# Patient Record
Sex: Female | Born: 2000 | Hispanic: Yes | Marital: Single | Smoking: Never smoker
Health system: Southern US, Community
[De-identification: ages and names within clinical notes are randomized; demographics above are authoritative.]

## PROBLEM LIST (undated history)

## (undated) DIAGNOSIS — F32A Depression, unspecified: Secondary | ICD-10-CM

## (undated) DIAGNOSIS — M329 Systemic lupus erythematosus, unspecified: Secondary | ICD-10-CM

## (undated) DIAGNOSIS — IMO0002 Reserved for concepts with insufficient information to code with codable children: Secondary | ICD-10-CM

## (undated) HISTORY — DX: Depression, unspecified: F32.A

## (undated) HISTORY — PX: RENAL BIOPSY: SHX156

---
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
## 2022-02-12 DIAGNOSIS — J029 Acute pharyngitis, unspecified: Secondary | ICD-10-CM | POA: Diagnosis not present

## 2022-05-09 DIAGNOSIS — M3214 Glomerular disease in systemic lupus erythematosus: Secondary | ICD-10-CM | POA: Diagnosis not present

## 2022-08-08 DIAGNOSIS — M3214 Glomerular disease in systemic lupus erythematosus: Secondary | ICD-10-CM | POA: Diagnosis not present

## 2022-09-23 DIAGNOSIS — M3214 Glomerular disease in systemic lupus erythematosus: Secondary | ICD-10-CM | POA: Diagnosis not present

## 2022-09-25 ENCOUNTER — Encounter: Payer: Self-pay | Admitting: Family

## 2022-09-25 ENCOUNTER — Ambulatory Visit (INDEPENDENT_AMBULATORY_CARE_PROVIDER_SITE_OTHER): Payer: Medicaid Other | Admitting: Family

## 2022-09-25 VITALS — BP 102/80 | HR 61 | Temp 98.2°F | Ht 63.5 in | Wt 131.8 lb

## 2022-09-25 DIAGNOSIS — K5904 Chronic idiopathic constipation: Secondary | ICD-10-CM | POA: Diagnosis not present

## 2022-09-25 DIAGNOSIS — M3214 Glomerular disease in systemic lupus erythematosus: Secondary | ICD-10-CM | POA: Diagnosis not present

## 2022-09-25 DIAGNOSIS — F4323 Adjustment disorder with mixed anxiety and depressed mood: Secondary | ICD-10-CM | POA: Diagnosis not present

## 2022-09-25 MED ORDER — HYDROXYCHLOROQUINE SULFATE 200 MG PO TABS
200.0000 mg | ORAL_TABLET | Freq: Two times a day (BID) | ORAL | 0 refills | Status: AC
Start: 2022-09-25 — End: ?

## 2022-09-25 NOTE — Assessment & Plan Note (Signed)
Stable.  Recommendation for otc benefiber to make it easier to get fiber intake.

## 2022-09-25 NOTE — Patient Instructions (Signed)
   I think this is a great option and appreciate you reaching out. From my experience in the past as far as referrals I recommend you call and reach out to the following two places to see if they have any Medicaid openings for therapy.   If they do please let me know and I will place the referral to the appropriate place.  Here are some medicaid recommendations, if these do not work out I suggest that you call your insurance and see available options and we can also place that referral appropriately with whatever you may find.  [facility] Phone # [phone] They have locations in [place], [place], [place], and [place]. They do take [employer] I know for sure and the [employer].   [facility] [phone]  [address] [place], [place], [place] [address]    [facility] [employer] [address] [place], [place] [address]   [phone]   [facility]- [phone]   [facility] [phone]   [facility]- [phone]   If at any time you feel your needs are more urgent or you are concerned for your well being please see the below options:  For walk in options for mental health:   [facility] [address] in [place], [place]. Call our 24-hour HelpLine at [phone] or [phone] for immediate assistance for mental health and substance abuse issues.  And or walk into [facility] hospital ER.   [employer] [facility]: 1-800-SUICIDE  [employer] Suicide Prevention Lifeline: 1-800-273-TALK  Regards,   Mort Sawyers FNP-C

## 2022-09-25 NOTE — Progress Notes (Unsigned)
New Patient Office Visit  Subjective:  Patient ID: Brianna Andersen, female    DOB: 07/02/2000  Age: 22 y.o. MRN: [id number]  CC:  Chief Complaint  Patient presents with   Establish Care    HPI Brianna Andersen is here to establish care as a new patient.  Oriented to practice routines and expectations.  Prior provider was: [facility] [place].  nephrology, Dr. Dorna Bloom  Dr. Lady Gary, Rheumatologist  Pt is without acute concerns.   chronic concerns:  Regular diet.  Exercise regularly.  Sleeps fairly well, less since school started.   SLE with nephritis, followed by rheumatology as well as nephology. Diagnosed around 22 y/o . On plaquenil 200 mg twice daily. Up to date on her eye exams.   Anxiety depression: on sertraline 100 mg once daily. Increased anxiety with school, had went off of it due to brain fog and restarted and now with brain fog again. May be open to changing to another medication in the future.   Constipation, trying to add more fiber to get things moving. She finds this gets better with increased fiber intake. Water daily working on this well.   ROS: Negative unless specifically indicated above in HPI.   Current Outpatient Medications:    Cholecalciferol 25 MCG (1000 UT) tablet, Take by mouth., Disp: , Rfl:    Cyanocobalamin (VITAMIN B-12 PO), Take by mouth., Disp: , Rfl:    hydroxychloroquine (PLAQUENIL) 200 MG tablet, Take 1 tablet (200 mg total) by mouth 2 (two) times daily., Disp: 180 tablet, Rfl: 0   ondansetron (ZOFRAN-ODT) 4 MG disintegrating tablet, Take 1 tablet (4 mg total) by mouth every 8 (eight) hours as needed for nausea or vomiting., Disp: 20 tablet, Rfl: 0   sertraline (ZOLOFT) 100 MG tablet, Take 1 tablet (100 mg total) by mouth daily., Disp: 90 tablet, Rfl: 3 Past Medical History:  Diagnosis Date   Depression    Lupus (HCC)    Past Surgical History:  Procedure Laterality Date   RENAL BIOPSY       Objective:   Today's Vitals: BP 102/80 (BP Location: Right Arm, Patient Position: Sitting, Cuff Size: Normal)   Pulse 61   Temp 98.2 F (36.8 C) (Temporal)   Ht 5' 3.5" (1.613 m)   Wt 131 lb 12.8 oz (59.8 kg)   LMP 09/18/2022   SpO2 97%   BMI 22.98 kg/m   Physical Exam Constitutional:      General: She is not in acute distress.    Appearance: Normal appearance. She is normal weight. She is not ill-appearing, toxic-appearing or diaphoretic.  HENT:     Head: Normocephalic.  Cardiovascular:     Rate and Rhythm: Normal rate and regular rhythm.  Pulmonary:     Effort: Pulmonary effort is normal.     Breath sounds: Normal breath sounds.  Musculoskeletal:        General: Normal range of motion.  Neurological:     General: No focal deficit present.     Mental Status: She is alert and oriented to person, place, and time. Mental status is at baseline.  Psychiatric:        Mood and Affect: Mood normal.        Behavior: Behavior normal.        Thought Content: Thought content normal.        Judgment: Judgment normal.     Assessment & Plan:  Lupus nephritis (HCC) Assessment & Plan: Follow up with  rheumatology and nephrology as scheduled. Continue plaquenil as prescribed.    Orders: -     Hydroxychloroquine Sulfate; Take 1 tablet (200 mg total) by mouth 2 (two) times daily.  Dispense: 180 tablet; Refill: 0  Adjustment disorder with mixed anxiety and depressed mood Assessment & Plan: Continue sertraline 100 mg once daily.  Consider lexapro in the future, once pt complete with chemistry semester.  Open to therapy.    Chronic idiopathic constipation Assessment & Plan: Stable.  Recommendation for otc benefiber to make it easier to get fiber intake.      Follow-up: Return in about 1 year (around 09/25/2023) for f/u CPE and pap .   Mort Sawyers, FNP

## 2022-09-25 NOTE — Assessment & Plan Note (Signed)
Continue sertraline 100 mg once daily.  Consider lexapro in the future, once pt complete with chemistry semester.  Open to therapy.

## 2022-09-25 NOTE — Assessment & Plan Note (Addendum)
Follow up with rheumatology and nephrology as scheduled. Continue plaquenil as prescribed.

## 2022-10-11 ENCOUNTER — Encounter: Payer: Self-pay | Admitting: Family

## 2022-10-14 ENCOUNTER — Ambulatory Visit (INDEPENDENT_AMBULATORY_CARE_PROVIDER_SITE_OTHER): Payer: Medicaid Other | Admitting: Internal Medicine

## 2022-10-14 ENCOUNTER — Ambulatory Visit (INDEPENDENT_AMBULATORY_CARE_PROVIDER_SITE_OTHER)
Admission: RE | Admit: 2022-10-14 | Discharge: 2022-10-14 | Disposition: A | Discharge status: Home / Self Care | Payer: Medicaid Other | Source: Ambulatory Visit | Attending: Internal Medicine | Admitting: Internal Medicine

## 2022-10-14 ENCOUNTER — Encounter: Payer: Self-pay | Admitting: Internal Medicine

## 2022-10-14 VITALS — BP 110/70 | HR 79 | Temp 99.1°F | Ht 63.5 in | Wt 132.0 lb

## 2022-10-14 DIAGNOSIS — M25531 Pain in right wrist: Secondary | ICD-10-CM

## 2022-10-14 NOTE — Assessment & Plan Note (Addendum)
Pull injury She feels it was dislocated and she reduced it Still tender Will check x-ray--looks okay but awaiting radiologist reading  If any findings--will set up with ortho Continue the brace Ice if pain Tylenol---can consider topical diclofenac (shouldn't affect kidneys) If x-ray negative--and ongoing pain in a week or so--would proceed with evaluation by sports medicine or ortho

## 2022-10-14 NOTE — Progress Notes (Signed)
Subjective:    Patient ID: Brianna Andersen, female    DOB: 07/06/2000, 22 y.o.   MRN: [id number]  HPI Here due to wrist pain  1 week ago--was walking dog and saw squirrel and ran (60#) Was able to hold her back--but noticed pain in her right wrist but not bad 3 days went by and felt her bone was in the wrong position Thinks it might have been dislocated--on ulnar side She was able to push it back--but still having pain  Trouble using it---worse though without the brace Some swelling at first---better with ice and tylenol Still using this  Current Outpatient Medications on File Prior to Visit  Medication Sig Dispense Refill   Cholecalciferol 25 MCG (1000 UT) tablet Take by mouth.     Cyanocobalamin (VITAMIN B-12 PO) Take by mouth.     hydroxychloroquine (PLAQUENIL) 200 MG tablet Take 1 tablet (200 mg total) by mouth 2 (two) times daily. 180 tablet 0   ondansetron (ZOFRAN-ODT) 4 MG disintegrating tablet Take 1 tablet (4 mg total) by mouth every 8 (eight) hours as needed for nausea or vomiting. 20 tablet 0   sertraline (ZOLOFT) 100 MG tablet Take 1 tablet (100 mg total) by mouth daily. 90 tablet 3   No current facility-administered medications on file prior to visit.    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  Tobacco Use   Smoking status: Never   Smokeless tobacco: Never  Vaping Use   Vaping status: Never Used  Substance and Sexual Activity   Alcohol use: No   Drug use: No   Sexual activity:  Never    Birth control/protection: Abstinence  Other Topics Concern   Not on file  Social History Narrative   Not on file   Social Determinants of Health   Financial Resource Strain: Not on file  Food Insecurity: No Food Insecurity (08/08/2022)   Received from [facility]   Hunger Vital Sign    Worried About Running Out of Food in the Last Year: Never true    Ran Out of Food in the Last Year: Never true  Transportation Needs: Not on file  Physical Activity: Not on file  Stress: Not on file  Social Connections: Not on file  Intimate Partner Violence: Not on file   Review of Systems     Objective:   Physical Exam Constitutional:      Appearance: Normal appearance.  Musculoskeletal:     Comments: Prominence of distal ulna at both wrists Tenderness on right at ulnar wrist and proximal hand  Neurological:     Mental Status: She is alert.            Assessment & Plan:

## 2022-12-04 DIAGNOSIS — M3214 Glomerular disease in systemic lupus erythematosus: Secondary | ICD-10-CM | POA: Diagnosis not present

## 2022-12-13 ENCOUNTER — Ambulatory Visit (HOSPITAL_COMMUNITY)
Admission: EM | Admit: 2022-12-13 | Discharge: 2022-12-13 | Disposition: A | Discharge status: Home / Self Care | Payer: Medicaid Other | Attending: Emergency Medicine | Admitting: Emergency Medicine

## 2022-12-13 ENCOUNTER — Encounter (HOSPITAL_COMMUNITY): Payer: Self-pay | Admitting: Emergency Medicine

## 2022-12-13 DIAGNOSIS — Z8739 Personal history of other diseases of the musculoskeletal system and connective tissue: Secondary | ICD-10-CM | POA: Insufficient documentation

## 2022-12-13 DIAGNOSIS — R609 Edema, unspecified: Secondary | ICD-10-CM | POA: Diagnosis not present

## 2022-12-13 DIAGNOSIS — L509 Urticaria, unspecified: Secondary | ICD-10-CM | POA: Diagnosis not present

## 2022-12-13 DIAGNOSIS — N9089 Other specified noninflammatory disorders of vulva and perineum: Secondary | ICD-10-CM | POA: Insufficient documentation

## 2022-12-13 LAB — CBC WITH DIFFERENTIAL/PLATELET
Abs Immature Granulocytes: 0 10*3/uL (ref 0.00–0.07)
Basophils Absolute: 0 10*3/uL (ref 0.0–0.1)
Basophils Relative: 0 %
Eosinophils Absolute: 0 10*3/uL (ref 0.0–0.5)
Eosinophils Relative: 0 %
HCT: 38.6 % (ref 36.0–46.0)
Hemoglobin: 13.3 g/dL (ref 12.0–15.0)
Immature Granulocytes: 0 %
Lymphocytes Relative: 33 %
Lymphs Abs: 1.5 10*3/uL (ref 0.7–4.0)
MCH: 29.5 pg (ref 26.0–34.0)
MCHC: 34.5 g/dL (ref 30.0–36.0)
MCV: 85.6 fL (ref 80.0–100.0)
Monocytes Absolute: 0.5 10*3/uL (ref 0.1–1.0)
Monocytes Relative: 11 %
Neutro Abs: 2.5 10*3/uL (ref 1.7–7.7)
Neutrophils Relative %: 56 %
Platelets: 187 10*3/uL (ref 150–400)
RBC: 4.51 MIL/uL (ref 3.87–5.11)
RDW: 12.2 % (ref 11.5–15.5)
WBC: 4.6 10*3/uL (ref 4.0–10.5)
nRBC: 0 % (ref 0.0–0.2)

## 2022-12-13 LAB — COMPREHENSIVE METABOLIC PANEL
ALT: 21 U/L (ref 0–44)
AST: 30 U/L (ref 15–41)
Albumin: 4.6 g/dL (ref 3.5–5.0)
Alkaline Phosphatase: 65 U/L (ref 38–126)
Anion gap: 9 (ref 5–15)
BUN: <5 mg/dL — ABNORMAL LOW (ref 6–20)
CO2: 23 mmol/L (ref 22–32)
Calcium: 9.5 mg/dL (ref 8.9–10.3)
Chloride: 107 mmol/L (ref 98–111)
Creatinine, Ser: 0.5 mg/dL (ref 0.44–1.00)
GFR, Estimated: >60 mL/min (ref >60)
Glucose, Bld: 89 mg/dL (ref 70–99)
Potassium: 3.5 mmol/L (ref 3.5–5.1)
Sodium: 139 mmol/L (ref 135–145)
Total Bilirubin: 1.7 mg/dL — ABNORMAL HIGH (ref <1.2)
Total Protein: 7.9 g/dL (ref 6.5–8.1)

## 2022-12-13 LAB — C-REACTIVE PROTEIN: CRP: <0.5 mg/dL (ref <1.0)

## 2022-12-13 MED ORDER — METHYLPREDNISOLONE 4 MG PO TBPK: ORAL_TABLET | ORAL | 0 refills | Status: DC

## 2022-12-13 MED ORDER — METHYLPREDNISOLONE ACETATE 80 MG/ML IJ SUSP
INTRAMUSCULAR | Status: AC
  Filled 2022-12-13: qty 1

## 2022-12-13 MED ORDER — METHYLPREDNISOLONE ACETATE 80 MG/ML IJ SUSP
60.0000 mg | Freq: Once | INTRAMUSCULAR | Status: AC
  Administered 2022-12-13: 60 mg via INTRAMUSCULAR

## 2022-12-13 NOTE — ED Provider Notes (Signed)
[facility]    CSN: [id number] Arrival date & time: 12/13/22  1329    HISTORY   Chief Complaint  Patient presents with   Urticaria   HPI Brianna Andersen is a pleasant, 22 y.o. female who presents to urgent care today. Patient complains of hives all over her body that began late yesterday.  Patient states she has noted no change in diet, personal hygiene products, household detergents or medications.  Patient states she took an extra strength Benadryl which did not provide any improvement of her symptoms.  Patient reports a history of lupus with abnormal lab results 9 days ago, states she has been taking Plaquenil as prescribed but is concerned that her lupus is is getting worse.  The history is provided by the patient.   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    Home Medications    Prior to Admission medications   Medication Sig Start Date End Date Taking? Authorizing Provider  ferrous sulfate 325 (65 FE) MG tablet Take 325 mg by mouth daily with breakfast. 12/05/22 12/05/23 Yes [provider]  Cholecalciferol 25 MCG (1000 UT) tablet Take by mouth.    [provider]  Cyanocobalamin (VITAMIN B-12 PO) Take by mouth.    [provider]  hydroxychloroquine (PLAQUENIL) 200 MG tablet Take 1 tablet (200 mg total) by mouth 2 (two) times daily. 09/25/22   Mort Sawyers, FNP  ondansetron (ZOFRAN-ODT) 4 MG disintegrating tablet Take 1 tablet (4 mg total) by mouth every 8 (eight) hours as needed for nausea or  vomiting. 02/22/19   Lady Deutscher, MD  sertraline (ZOLOFT) 100 MG tablet Take 1 tablet (100 mg total) by mouth daily. 12/13/20   Lady Deutscher, MD    Family History Family History  Problem Relation Age of Onset   Stroke Paternal Grandfather    Social History Social History   Tobacco Use   Smoking status: Never   Smokeless tobacco: Never  Vaping Use   Vaping status: Never Used  Substance Use Topics   Alcohol use: No   Drug use: No   Allergies   Patient has no known allergies.  Review of Systems Review of Systems Pertinent findings revealed after performing a 14 point review of systems has been noted in the history of present illness.  Physical Exam Vital Signs BP (!) 128/96 (BP Location: Left Arm)   Pulse 98   Temp 98.4 F (36.9 C) (Oral)   Resp 16   LMP 11/19/2022 (Exact Date)   SpO2 95%   No data found.  Physical Exam Vitals and nursing note reviewed.  Constitutional:      General: She is not in acute distress.    Appearance: Normal appearance.  HENT:     Head: Normocephalic and atraumatic.  Eyes:     Pupils: Pupils are equal, round, and reactive to light.  Cardiovascular:     Rate and Rhythm: Normal rate and regular rhythm.  Pulmonary:     Effort: Pulmonary effort is  normal.     Breath sounds: Normal breath sounds.  Musculoskeletal:        General: Normal range of motion.     Cervical back: Normal range of motion and neck supple.  Skin:    General: Skin is warm and dry.     Findings: Rash (Urticarial rash located on abdomen and back.  Patient reports itching on upper and lower extremities but I do not appreciate any signs of urticaria in these areas.) present.  Neurological:     General: No focal deficit present.     Mental Status: She is alert and oriented to person, place, and time. Mental status is at baseline.  Psychiatric:        Mood and Affect: Mood normal.        Behavior: Behavior normal.        Thought Content: Thought content normal.         Judgment: Judgment normal.     Visual Acuity Right Eye Distance:   Left Eye Distance:   Bilateral Distance:    Right Eye Near:   Left Eye Near:    Bilateral Near:     UC Couse / Diagnostics / Procedures:     Radiology No results found.  Procedures Procedures (including critical care time) EKG  Pending results:  Labs Reviewed  COMPREHENSIVE METABOLIC PANEL  C-REACTIVE PROTEIN  CBC WITH DIFFERENTIAL/PLATELET  CERVICOVAGINAL ANCILLARY ONLY    Medications Ordered in UC: Medications  methylPREDNISolone acetate (DEPO-MEDROL) injection 60 mg (has no administration in time range)    UC Diagnoses / Final Clinical Impressions(s)   I have reviewed the triage vital signs and the nursing notes.  Pertinent labs & imaging results that were available during my care of the patient were reviewed by me and considered in my medical decision making (see chart for details).    Final diagnoses:  Urticaria  History of lupus nephritis  Vulvar edema   Patient provided with an injection of methylprednisolone during her visit for rapid relief of hives.  Patient advised to continue a tapering dose of methylprednisolone as well.    Comprehensive metabolic panel repeated due to history of lupus nephritis.  If kidney function is worsening, recommend acute follow-up with her lupus doctor.  CBC performed to look for eosinophils and CRP were performed to look for acute inflammation, both of which would help determine whether her urticaria is due to an acute allergic reaction or to a resurgence of her lupus nephritis.  Patient advised to follow-up with her lupus doctor regardless to report symptoms and treatment discussed and provided today.  Please see discharge instructions below for details of plan of care as provided to patient. ED Prescriptions     Medication Sig Dispense Auth. Provider   methylPREDNISolone (MEDROL DOSEPAK) 4 MG TBPK tablet Take 24 mg on day 1, 20 mg on day 2, 16 mg on  day 3, 12 mg on day 4, 8 mg on day 5, 4 mg on day 6.  Take all tablets in each row at once, do not spread tablets out throughout the day. 21 tablet Theadora Rama Scales, PA-C      PDMP not reviewed this encounter.  Pending results:  Labs Reviewed  COMPREHENSIVE METABOLIC PANEL  C-REACTIVE PROTEIN  CBC WITH DIFFERENTIAL/PLATELET  CERVICOVAGINAL ANCILLARY ONLY    Discharge Instructions:   Discharge Instructions      For acute treatment of urticaria, you are provided with an injection of methylprednisolone during your visit, this is a  potent steroid which should quickly resolve your symptoms of itching, swelling and redness.    For continued suppression of your itching, swelling and redness, please pick up and begin your prescription for methylprednisolone tablets.  Please take 1 row of tablets each morning with your breakfast meal starting tomorrow morning.  We have repeated some of the blood work that was performed by your lupus provider 9 days ago.  These include a complete metabolic panel which evaluates your liver and kidney function, a complete blood cell count which will help [place] determine whether or not your hives are related to an allergic reaction or to your lupus and a C-reactive protein test was also looks for acute inflammation versus worsening of your lupus.  You were also tested for vaginal yeast infection.  We will contact you with the results of these tests once we have received them from the lab.  If any further treatment is needed, we will let you know.  Thank you for visiting [facility] today.  We appreciate the opportunity to participate in your care.      Disposition Upon Discharge:  Condition: stable for discharge home  Patient presented with an acute illness with associated systemic symptoms and significant discomfort requiring urgent management. In my opinion, this is a condition that a prudent lay person (someone who possesses an average  knowledge of health and medicine) may potentially expect to result in complications if not addressed urgently such as respiratory distress, impairment of bodily function or dysfunction of bodily organs.   Routine symptom specific, illness specific and/or disease specific instructions were discussed with the patient and/or caregiver at length.   As such, the patient has been evaluated and assessed, work-up was performed and treatment was provided in alignment with urgent care protocols and evidence based medicine.  Patient/parent/caregiver has been advised that the patient may require follow up for further testing and treatment if the symptoms continue in spite of treatment, as clinically indicated and appropriate.  Patient/parent/caregiver has been advised to return to the [facility] or PCP if no better; to PCP or the Emergency Department if new signs and symptoms develop, or if the current signs or symptoms continue to change or worsen for further workup, evaluation and treatment as clinically indicated and appropriate  The patient will follow up with their current PCP if and as advised. If the patient does not currently have a PCP we will assist them in obtaining one.   The patient may need specialty follow up if the symptoms continue, in spite of conservative treatment and management, for further workup, evaluation, consultation and treatment as clinically indicated and appropriate.  Patient/parent/caregiver verbalized understanding and agreement of plan as discussed.  All questions were addressed during visit.  Please see discharge instructions below for further details of plan.  This office note has been dictated using Teaching laboratory technician.  Unfortunately, this method of dictation can sometimes lead to typographical or grammatical errors.  I apologize for your inconvenience in advance if this occurs.  Please do not hesitate to reach out to me if clarification is needed.      Theadora Rama Scales, [place] 12/13/22 [phone]

## 2022-12-13 NOTE — Discharge Instructions (Addendum)
For acute treatment of urticaria, you are provided with an injection of methylprednisolone during your visit, this is a potent steroid which should quickly resolve your symptoms of itching, swelling and redness.    For continued suppression of your itching, swelling and redness, please pick up and begin your prescription for methylprednisolone tablets.  Please take 1 row of tablets each morning with your breakfast meal starting tomorrow morning.  We have repeated some of the blood work that was performed by your lupus provider 9 days ago.  These include a complete metabolic panel which evaluates your liver and kidney function, a complete blood cell count which will help [place] determine whether or not your hives are related to an allergic reaction or to your lupus and a C-reactive protein test was also looks for acute inflammation versus worsening of your lupus.  You were also tested for vaginal yeast infection.  We will contact you with the results of these tests once we have received them from the lab.  If any further treatment is needed, we will let you know.  Thank you for visiting [facility] today.  We appreciate the opportunity to participate in your care.

## 2022-12-13 NOTE — ED Triage Notes (Signed)
Pt present with hives all over body that began late yesterday.  Pt states she has had no changes in diet or products recently. She took extra strength benadryl that did not help with hives.  She is also concerned because she has lupus and was told her labs were off recently

## 2022-12-18 LAB — CERVICOVAGINAL ANCILLARY ONLY
Bacterial Vaginitis (gardnerella): NEGATIVE
Candida Glabrata: NEGATIVE
Candida Vaginitis: NEGATIVE
Comment: NEGATIVE
Comment: NEGATIVE
Comment: NEGATIVE

## 2023-04-25 ENCOUNTER — Ambulatory Visit: Payer: Self-pay

## 2023-04-25 NOTE — Telephone Encounter (Signed)
 Second attempt to reach pt. Used Doctor, hospital # [phone]. Left message to call back.

## 2023-04-25 NOTE — Telephone Encounter (Signed)
 Reason for Triage: Patient has a bump near her anus area 3 days ago, hurts when pressed. Wanting to know if something can be called in for her   Used Spanish interpreter [place] # [phone], left message to call back.

## 2023-04-28 NOTE — Telephone Encounter (Signed)
 NOTED

## 2023-06-23 DIAGNOSIS — M3214 Glomerular disease in systemic lupus erythematosus: Secondary | ICD-10-CM | POA: Diagnosis not present

## 2023-08-15 ENCOUNTER — Ambulatory Visit: Payer: Self-pay

## 2023-08-15 NOTE — Telephone Encounter (Signed)
 FYI Only or Action Required?: FYI only for provider.  Patient was last seen in primary care on 10/14/2022 by Jimmy Charlie FERNS, MD.  Called Nurse Triage reporting PARASITES.  Symptoms began a week ago.  Interventions attempted: Nothing.  Symptoms are: unchanged.  Triage Disposition: No disposition on file.  Patient/caregiver understands and will follow disposition?:  Reason for Disposition  [1] Abnormal color is unexplained AND [2] persists > 24 hours  Answer Assessment - Initial Assessment Questions Patient unable to be seen until Monday. ED precautions reviewed, pt verbalized understanding.   1. COLOR: What color is your stool? Is that color in part or all of the stool? (e.g., black, clay-colored, green, red)      Normal color, formed stool with white worms in it. Vacationed in [place] in January.  2. ONSET: When did you first notice this color change?     One week  3. CAUSE: Have you eaten any food or taken any medicine of this color? Note: See listing in Background Information section.      No  4. OTHER SYMPTOMS: Do you have any other symptoms? (e.g., abdomen pain, diarrhea, fever, yellow eyes or skin).     Mild bloating. Denies abdominal pain, fever, diarrhea, nausea, vomiting or blood in the stool  Protocols used: Stools - Unusual Color-A-AH Copied from CRM [id number]. Topic: Clinical - Red Word Triage >> Aug 15, 2023 11:27 AM Harlene ORN wrote: Red Word that prompted transfer to Nurse Triage: came back from [place] in January. Now showing signs of intestinal parasites. White worms in her stool.

## 2023-08-18 ENCOUNTER — Encounter: Payer: Self-pay | Admitting: Family

## 2023-08-18 ENCOUNTER — Ambulatory Visit (INDEPENDENT_AMBULATORY_CARE_PROVIDER_SITE_OTHER): Admitting: Family

## 2023-08-18 VITALS — BP 114/68 | HR 75 | Temp 98.4°F | Ht 63.5 in | Wt 137.6 lb

## 2023-08-18 DIAGNOSIS — B839 Helminthiasis, unspecified: Secondary | ICD-10-CM | POA: Diagnosis not present

## 2023-08-18 MED ORDER — ALBENDAZOLE 200 MG PO TABS: ORAL_TABLET | ORAL | 0 refills | Status: AC

## 2023-08-18 NOTE — Telephone Encounter (Signed)
 NOTED

## 2023-08-18 NOTE — Progress Notes (Unsigned)
 Established Patient Office Visit  Subjective:   Patient ID: Brianna Andersen, female    DOB: [birth year]  Age: 23 y.o. MRN: [id number]  CC:  Chief Complaint  Patient presents with   Acute Visit    1-2 weeks ago and she noticed a white worm in her stool. Her sister was diagnosed with worms. She thinks this may be from a recent trip to [place].    HPI: Brianna Andersen is a 23 y.o. female presenting on 08/18/2023 for Acute Visit (1-2 weeks ago and she noticed a white worm in her stool. Her sister was diagnosed with worms. She thinks this may be from a recent trip to [place].)  1-2 weeks ago noticed a white worm in her stool. She has noticed more in her stool since, not moving. No abn weight loss, no fever. No abdominal pain or heart burn. No change in bowel movements.  Went to [place] back in January.  Sister was diagnosed with worms and she was given treatment, she was treated for pinworms.   Wt Readings from Last 3 Encounters:  08/18/23 137 lb 9.6 oz (62.4 kg)  10/14/22 132 lb (59.9 kg)  09/25/22 131 lb 12.8 oz (59.8 kg)           ROS: Negative unless specifically indicated above in HPI.   Relevant past medical history reviewed and updated as indicated.   Allergies and medications reviewed and updated.   Current Outpatient Medications:    albendazole  (ALBENZA ) 200 MG tablet, Take two pills for one dose and then repeat again in two weeks x one dose, Disp: 4 tablet, Rfl: 0   Cholecalciferol 25 MCG (1000 UT) tablet, Take by mouth., Disp: , Rfl:    Cyanocobalamin (VITAMIN B-12 PO), Take by mouth., Disp: , Rfl:    ferrous sulfate 325 (65 FE) MG tablet, Take 325 mg by mouth daily with breakfast., Disp: , Rfl:    hydroxychloroquine  (PLAQUENIL ) 200 MG tablet, Take 1 tablet (200 mg total) by mouth 2 (two) times daily., Disp: 180 tablet, Rfl: 0  No Known Allergies  Objective:   BP 114/68 (BP Location: Left Arm, Patient Position: Sitting, Cuff Size: Normal)   Pulse  75   Temp 98.4 F (36.9 C) (Temporal)   Ht 5' 3.5 (1.613 m)   Wt 137 lb 9.6 oz (62.4 kg)   LMP 08/02/2023 (Exact Date)   SpO2 100%   BMI 23.99 kg/m    Physical Exam Vitals reviewed.  Constitutional:      General: She is not in acute distress.    Appearance: Normal appearance. She is normal weight. She is not ill-appearing, toxic-appearing or diaphoretic.  HENT:     Head: Normocephalic.  Cardiovascular:     Rate and Rhythm: Normal rate.  Pulmonary:     Effort: Pulmonary effort is normal.  Musculoskeletal:        General: Normal range of motion.  Neurological:     General: No focal deficit present.     Mental Status: She is alert and oriented to person, place, and time. Mental status is at baseline.  Psychiatric:        Mood and Affect: Mood normal.        Behavior: Behavior normal.        Thought Content: Thought content normal.        Judgment: Judgment normal.     Assessment & Plan:  Worms in stool Assessment & Plan: Confirmed exposure to pin worms with sister Advised her  to notify everyone in the household to be treated Rx albendazole  400 mg x one dose, repeat again in two weeks.  Stool cultures ordered pending  Orders: -     Albendazole ; Take two pills for one dose and then repeat again in two weeks x one dose  Dispense: 4 tablet; Refill: 0 -     Ova and parasite examination; Future     Follow up plan: Return if symptoms worsen or fail to improve.  Ginger Patrick, FNP

## 2023-08-19 NOTE — Assessment & Plan Note (Addendum)
 Confirmed exposure to pin worms with sister Advised her to notify everyone in the household to be treated Rx albendazole  400 mg x one dose, repeat again in two weeks.  Stool cultures ordered pending

## 2023-08-25 ENCOUNTER — Telehealth: Payer: Self-pay | Admitting: Family

## 2023-08-25 ENCOUNTER — Other Ambulatory Visit: Payer: Self-pay

## 2023-08-25 DIAGNOSIS — B839 Helminthiasis, unspecified: Secondary | ICD-10-CM | POA: Diagnosis not present

## 2023-08-25 NOTE — Telephone Encounter (Signed)
 Copied from CRM [id number]. Topic: General - Other >> Aug 25, 2023  1:23 PM Brianna Andersen wrote: Reason for CRM: Patient would like to know how she should do her stool test please contact via mychart

## 2023-08-28 LAB — OVA AND PARASITE EXAMINATION
CONCENTRATE RESULT:: NONE SEEN
MICRO NUMBER:: 16813551
SPECIMEN QUALITY:: ADEQUATE
TRICHROME RESULT:: NONE SEEN

## 2023-09-01 ENCOUNTER — Ambulatory Visit: Payer: Self-pay | Admitting: Family

## 2023-12-02 DIAGNOSIS — M3214 Glomerular disease in systemic lupus erythematosus: Secondary | ICD-10-CM | POA: Diagnosis not present
